# Patient Record
Sex: Female | Born: 1965 | Race: White | Hispanic: No | Marital: Married | Smoking: Never smoker
Health system: Southern US, Community
[De-identification: ages and names within clinical notes are randomized; demographics above are authoritative.]

## PROBLEM LIST (undated history)

## (undated) DIAGNOSIS — M1990 Unspecified osteoarthritis, unspecified site: Secondary | ICD-10-CM

## (undated) HISTORY — PX: BACK SURGERY: SHX140

---

## 1998-01-06 ENCOUNTER — Inpatient Hospital Stay (HOSPITAL_COMMUNITY): Admission: AD | Admit: 1998-01-06 | Discharge: 1998-01-06 | Payer: Self-pay | Admitting: Obstetrics and Gynecology

## 1998-01-15 ENCOUNTER — Inpatient Hospital Stay (HOSPITAL_COMMUNITY): Admission: AD | Admit: 1998-01-15 | Discharge: 1998-01-15 | Payer: Self-pay | Admitting: Obstetrics and Gynecology

## 1998-01-17 ENCOUNTER — Inpatient Hospital Stay (HOSPITAL_COMMUNITY): Admission: AD | Admit: 1998-01-17 | Discharge: 1998-01-20 | Payer: Self-pay | Admitting: Obstetrics & Gynecology

## 1998-01-21 ENCOUNTER — Encounter (HOSPITAL_COMMUNITY): Admission: RE | Admit: 1998-01-21 | Discharge: 1998-03-04 | Payer: Self-pay | Admitting: Obstetrics & Gynecology

## 1999-02-23 ENCOUNTER — Other Ambulatory Visit: Admission: RE | Admit: 1999-02-23 | Discharge: 1999-02-23 | Payer: Self-pay | Admitting: Obstetrics and Gynecology

## 2002-10-26 ENCOUNTER — Other Ambulatory Visit: Admission: RE | Admit: 2002-10-26 | Discharge: 2002-10-26 | Payer: Self-pay | Admitting: Obstetrics and Gynecology

## 2009-01-21 ENCOUNTER — Ambulatory Visit: Payer: Self-pay | Admitting: Licensed Clinical Social Worker

## 2009-01-26 ENCOUNTER — Ambulatory Visit: Payer: Self-pay | Admitting: Licensed Clinical Social Worker

## 2015-02-24 ENCOUNTER — Ambulatory Visit: Payer: Self-pay | Admitting: Physical Therapy

## 2015-05-31 ENCOUNTER — Other Ambulatory Visit: Payer: Self-pay | Admitting: Physician Assistant

## 2015-05-31 NOTE — H&P (Addendum)
Melissa [name] comes in today for a follow-up. I went over her skin with her. Given her symptoms and what she does, at the end of the day I do not think this will ever improve until she has an arthroscopy and decompression.  Her scan shows a picture of impingement with degenerative signal within the distal supraspinatus tendon and tuberosity. There are some findings of adhesive capsulitis but on exam that is really not the case.    PHYSICAL EXAMINATION:  Lungs clear to auscultation bilaterally.  Heart sounds normal. She has markedly positive impingement. She lacks a little internal rotation but there is not a lot of adhesive capsulitis.   PLAN:  I reviewed the report and the scan; I spent more than 25 minutes with Melissa [name] and her husband going over things.  We will try one more shot. Continue with her exercise program.  In the interim I will go ahead and proceed with scheduling an arthroscopy and decompression.  The procedure, risks and benefits as complications were reviewed. How long she will be out of work is reviewed.   I will wait to hear from her for a definitive decision after we see how she does with the most recent shot.   PROCEDURE NOTE: The patient's clinical condition is marked by substantial pain and/or significant functional disability. Other conservative therapy has not provided relief, is contraindicated, or not appropriate. There is a reasonable likelihood that injection will significantly improve the patient's pain and/or functional disability.   After appropriate consent and under sterile technique the posterior shoulder is prepped with Betadine and alcohol and injected into the subacromial interval with 40 mg cc of Depo-Medrol and 1 cc of Marcaine.  Patient tolerates the procedure without difficulty.

## 2015-06-02 ENCOUNTER — Encounter (HOSPITAL_BASED_OUTPATIENT_CLINIC_OR_DEPARTMENT_OTHER): Payer: Self-pay | Admitting: *Deleted

## 2015-06-09 ENCOUNTER — Ambulatory Visit (HOSPITAL_BASED_OUTPATIENT_CLINIC_OR_DEPARTMENT_OTHER): Payer: 59 | Admitting: Anesthesiology

## 2015-06-09 ENCOUNTER — Encounter (HOSPITAL_BASED_OUTPATIENT_CLINIC_OR_DEPARTMENT_OTHER): Payer: Self-pay | Admitting: *Deleted

## 2015-06-09 ENCOUNTER — Ambulatory Visit (HOSPITAL_BASED_OUTPATIENT_CLINIC_OR_DEPARTMENT_OTHER)
Admission: RE | Admit: 2015-06-09 | Discharge: 2015-06-09 | Disposition: A | Discharge status: Home / Self Care | Payer: 59 | Source: Ambulatory Visit | Attending: Orthopedic Surgery | Admitting: Orthopedic Surgery

## 2015-06-09 ENCOUNTER — Encounter (HOSPITAL_BASED_OUTPATIENT_CLINIC_OR_DEPARTMENT_OTHER)
Admission: RE | Disposition: A | Discharge status: Home / Self Care | Payer: Self-pay | Source: Ambulatory Visit | Attending: Orthopedic Surgery

## 2015-06-09 DIAGNOSIS — M7541 Impingement syndrome of right shoulder: Secondary | ICD-10-CM | POA: Insufficient documentation

## 2015-06-09 DIAGNOSIS — M19011 Primary osteoarthritis, right shoulder: Secondary | ICD-10-CM | POA: Insufficient documentation

## 2015-06-09 HISTORY — PX: RESECTION DISTAL CLAVICAL: SHX5053

## 2015-06-09 HISTORY — PX: SHOULDER ARTHROSCOPY WITH ROTATOR CUFF REPAIR AND SUBACROMIAL DECOMPRESSION: SHX5686

## 2015-06-09 HISTORY — DX: Unspecified osteoarthritis, unspecified site: M19.90

## 2015-06-09 SURGERY — SHOULDER ARTHROSCOPY WITH ROTATOR CUFF REPAIR AND SUBACROMIAL DECOMPRESSION
Anesthesia: Regional | Site: Shoulder | Laterality: Right

## 2015-06-09 MED ORDER — OXYCODONE HCL 5 MG PO TABS: 5.0000 mg | ORAL_TABLET | Freq: Once | ORAL | Status: DC | PRN

## 2015-06-09 MED ORDER — ONDANSETRON HCL 4 MG/2ML IJ SOLN
INTRAMUSCULAR | Status: DC | PRN
  Administered 2015-06-09: 4 mg via INTRAVENOUS

## 2015-06-09 MED ORDER — ONDANSETRON HCL 4 MG PO TABS: 4.0000 mg | ORAL_TABLET | Freq: Three times a day (TID) | ORAL | Status: AC | PRN

## 2015-06-09 MED ORDER — CEFAZOLIN SODIUM-DEXTROSE 2-3 GM-% IV SOLR
INTRAVENOUS | Status: AC
  Filled 2015-06-09: qty 50

## 2015-06-09 MED ORDER — LABETALOL HCL 5 MG/ML IV SOLN
INTRAVENOUS | Status: DC | PRN
  Administered 2015-06-09: 2.5 mg via INTRAVENOUS

## 2015-06-09 MED ORDER — SODIUM CHLORIDE 0.9 % IR SOLN
Status: DC | PRN
  Administered 2015-06-09: 11000 mL

## 2015-06-09 MED ORDER — MIDAZOLAM HCL 2 MG/2ML IJ SOLN
1.0000 mg | INTRAMUSCULAR | Status: DC | PRN
  Administered 2015-06-09: 2 mg via INTRAVENOUS

## 2015-06-09 MED ORDER — MEPERIDINE HCL 25 MG/ML IJ SOLN: 6.2500 mg | INTRAMUSCULAR | Status: DC | PRN

## 2015-06-09 MED ORDER — OXYCODONE HCL 5 MG/5ML PO SOLN: 5.0000 mg | Freq: Once | ORAL | Status: DC | PRN

## 2015-06-09 MED ORDER — LACTATED RINGERS IV SOLN
INTRAVENOUS | Status: DC
  Administered 2015-06-09: 08:00:00 via INTRAVENOUS

## 2015-06-09 MED ORDER — MIDAZOLAM HCL 2 MG/2ML IJ SOLN
INTRAMUSCULAR | Status: AC
  Filled 2015-06-09: qty 2

## 2015-06-09 MED ORDER — CEFAZOLIN SODIUM-DEXTROSE 2-3 GM-% IV SOLR
2.0000 g | INTRAVENOUS | Status: AC
  Administered 2015-06-09: 2 g via INTRAVENOUS

## 2015-06-09 MED ORDER — PROPOFOL 10 MG/ML IV BOLUS
INTRAVENOUS | Status: DC | PRN
  Administered 2015-06-09: 200 mg via INTRAVENOUS

## 2015-06-09 MED ORDER — OXYCODONE-ACETAMINOPHEN 5-325 MG PO TABS: 1.0000 | ORAL_TABLET | ORAL | Status: DC | PRN

## 2015-06-09 MED ORDER — DEXAMETHASONE SODIUM PHOSPHATE 10 MG/ML IJ SOLN
INTRAMUSCULAR | Status: AC
  Filled 2015-06-09: qty 1

## 2015-06-09 MED ORDER — SCOPOLAMINE 1 MG/3DAYS TD PT72: 1.0000 | MEDICATED_PATCH | Freq: Once | TRANSDERMAL | Status: DC | PRN

## 2015-06-09 MED ORDER — LABETALOL HCL 5 MG/ML IV SOLN
INTRAVENOUS | Status: AC
  Filled 2015-06-09: qty 4

## 2015-06-09 MED ORDER — FENTANYL CITRATE (PF) 100 MCG/2ML IJ SOLN
50.0000 ug | INTRAMUSCULAR | Status: DC | PRN
  Administered 2015-06-09: 50 ug via INTRAVENOUS
  Administered 2015-06-09: 100 ug via INTRAVENOUS

## 2015-06-09 MED ORDER — LIDOCAINE HCL (CARDIAC) 20 MG/ML IV SOLN
INTRAVENOUS | Status: AC
  Filled 2015-06-09: qty 5

## 2015-06-09 MED ORDER — GLYCOPYRROLATE 0.2 MG/ML IJ SOLN: 0.2000 mg | Freq: Once | INTRAMUSCULAR | Status: DC | PRN

## 2015-06-09 MED ORDER — ONDANSETRON HCL 4 MG/2ML IJ SOLN
INTRAMUSCULAR | Status: AC
  Filled 2015-06-09: qty 2

## 2015-06-09 MED ORDER — DEXAMETHASONE SODIUM PHOSPHATE 4 MG/ML IJ SOLN
INTRAMUSCULAR | Status: DC | PRN
  Administered 2015-06-09: 10 mg via INTRAVENOUS

## 2015-06-09 MED ORDER — PROMETHAZINE HCL 25 MG/ML IJ SOLN: 6.2500 mg | INTRAMUSCULAR | Status: DC | PRN

## 2015-06-09 MED ORDER — LACTATED RINGERS IV SOLN: INTRAVENOUS | Status: DC

## 2015-06-09 MED ORDER — SUCCINYLCHOLINE CHLORIDE 20 MG/ML IJ SOLN
INTRAMUSCULAR | Status: DC | PRN
  Administered 2015-06-09: 100 mg via INTRAVENOUS

## 2015-06-09 MED ORDER — CHLORHEXIDINE GLUCONATE 4 % EX LIQD: 60.0000 mL | Freq: Once | CUTANEOUS | Status: DC

## 2015-06-09 MED ORDER — FENTANYL CITRATE (PF) 100 MCG/2ML IJ SOLN
INTRAMUSCULAR | Status: AC
  Filled 2015-06-09: qty 2

## 2015-06-09 MED ORDER — BUPIVACAINE-EPINEPHRINE (PF) 0.5% -1:200000 IJ SOLN
INTRAMUSCULAR | Status: DC | PRN
  Administered 2015-06-09: 30 mL via PERINEURAL

## 2015-06-09 MED ORDER — FENTANYL CITRATE (PF) 100 MCG/2ML IJ SOLN
INTRAMUSCULAR | Status: AC
Start: 2015-06-09 — End: 2015-06-09
  Filled 2015-06-09: qty 2

## 2015-06-09 MED ORDER — HYDROMORPHONE HCL 1 MG/ML IJ SOLN
0.2500 mg | INTRAMUSCULAR | Status: DC | PRN
Start: 2015-06-09 — End: 2015-06-09

## 2015-06-09 MED ORDER — LIDOCAINE HCL (CARDIAC) 20 MG/ML IV SOLN
INTRAVENOUS | Status: DC | PRN
  Administered 2015-06-09: 50 mg via INTRAVENOUS

## 2015-06-09 SURGICAL SUPPLY — 70 items
ANCHOR SUT BIO SW 4.75X19.1 (Anchor) ×6 IMPLANT
BENZOIN TINCTURE PRP APPL 2/3 (GAUZE/BANDAGES/DRESSINGS) IMPLANT
BLADE CUTTER GATOR 3.5 (BLADE) ×3 IMPLANT
BLADE CUTTER MENIS 5.5 (BLADE) IMPLANT
BLADE GREAT WHITE 4.2 (BLADE) ×2 IMPLANT
BLADE GREAT WHITE 4.2MM (BLADE) ×1
BLADE SURG 15 STRL LF DISP TIS (BLADE) IMPLANT
BLADE SURG 15 STRL SS (BLADE)
BUR OVAL 6.0 (BURR) ×3 IMPLANT
CANNULA DRY DOC 8X75 (CANNULA) ×3 IMPLANT
CANNULA TWIST IN 8.25X7CM (CANNULA) IMPLANT
CLOSURE WOUND 1/2 X4 (GAUZE/BANDAGES/DRESSINGS)
DECANTER SPIKE VIAL GLASS SM (MISCELLANEOUS) IMPLANT
DRAPE STERI 35X30 U-POUCH (DRAPES) ×3 IMPLANT
DRAPE U-SHAPE 47X51 STRL (DRAPES) ×3 IMPLANT
DRAPE U-SHAPE 76X120 STRL (DRAPES) ×6 IMPLANT
DRSG PAD ABDOMINAL 8X10 ST (GAUZE/BANDAGES/DRESSINGS) ×3 IMPLANT
DURAPREP 26ML APPLICATOR (WOUND CARE) ×3 IMPLANT
ELECT MENISCUS 165MM 90D (ELECTRODE) ×3 IMPLANT
ELECT REM PT RETURN 9FT ADLT (ELECTROSURGICAL) ×3
ELECTRODE REM PT RTRN 9FT ADLT (ELECTROSURGICAL) ×1 IMPLANT
GAUZE SPONGE 4X4 12PLY STRL (GAUZE/BANDAGES/DRESSINGS) ×6 IMPLANT
GAUZE XEROFORM 1X8 LF (GAUZE/BANDAGES/DRESSINGS) ×3 IMPLANT
GLOVE BIOGEL PI IND STRL 7.0 (GLOVE) ×3 IMPLANT
GLOVE BIOGEL PI INDICATOR 7.0 (GLOVE) ×6
GLOVE ECLIPSE 6.5 STRL STRAW (GLOVE) ×3 IMPLANT
GLOVE ECLIPSE 7.0 STRL STRAW (GLOVE) ×3 IMPLANT
GLOVE SURG ORTHO 8.0 STRL STRW (GLOVE) ×3 IMPLANT
GOWN STRL REUS W/ TWL LRG LVL3 (GOWN DISPOSABLE) ×2 IMPLANT
GOWN STRL REUS W/ TWL XL LVL3 (GOWN DISPOSABLE) ×1 IMPLANT
GOWN STRL REUS W/TWL LRG LVL3 (GOWN DISPOSABLE) ×4
GOWN STRL REUS W/TWL XL LVL3 (GOWN DISPOSABLE) ×2
IV NS IRRIG 3000ML ARTHROMATIC (IV SOLUTION) ×12 IMPLANT
MANIFOLD NEPTUNE II (INSTRUMENTS) ×3 IMPLANT
NDL SUT 6 .5 CRC .975X.05 MAYO (NEEDLE) IMPLANT
NEEDLE MAYO TAPER (NEEDLE)
NEEDLE SCORPION MULTI FIRE (NEEDLE) ×3 IMPLANT
NS IRRIG 1000ML POUR BTL (IV SOLUTION) IMPLANT
PACK ARTHROSCOPY DSU (CUSTOM PROCEDURE TRAY) ×3 IMPLANT
PACK BASIN DAY SURGERY FS (CUSTOM PROCEDURE TRAY) ×3 IMPLANT
PASSER SUT SWANSON 36MM LOOP (INSTRUMENTS) IMPLANT
PENCIL BUTTON HOLSTER BLD 10FT (ELECTRODE) ×3 IMPLANT
SET ARTHROSCOPY TUBING (MISCELLANEOUS) ×2
SET ARTHROSCOPY TUBING LN (MISCELLANEOUS) ×1 IMPLANT
SLEEVE SCD COMPRESS KNEE MED (MISCELLANEOUS) IMPLANT
SLING ARM FOAM STRAP LRG (SOFTGOODS) IMPLANT
SLING ARM IMMOBILIZER LRG (SOFTGOODS) ×3 IMPLANT
SLING ARM IMMOBILIZER MED (SOFTGOODS) IMPLANT
SLING ARM MED ADULT FOAM STRAP (SOFTGOODS) IMPLANT
SLING ARM XL FOAM STRAP (SOFTGOODS) IMPLANT
SPONGE LAP 4X18 X RAY DECT (DISPOSABLE) IMPLANT
STRIP CLOSURE SKIN 1/2X4 (GAUZE/BANDAGES/DRESSINGS) IMPLANT
SUCTION FRAZIER TIP 10 FR DISP (SUCTIONS) IMPLANT
SUT ETHIBOND 2 OS 4 DA (SUTURE) IMPLANT
SUT ETHILON 2 0 FS 18 (SUTURE) IMPLANT
SUT ETHILON 3 0 PS 1 (SUTURE) IMPLANT
SUT FIBERWIRE #2 38 T-5 BLUE (SUTURE)
SUT RETRIEVER MED (INSTRUMENTS) IMPLANT
SUT TIGER TAPE 7 IN WHITE (SUTURE) ×6 IMPLANT
SUT VIC AB 0 CT1 27 (SUTURE)
SUT VIC AB 0 CT1 27XBRD ANBCTR (SUTURE) IMPLANT
SUT VIC AB 2-0 SH 27 (SUTURE)
SUT VIC AB 2-0 SH 27XBRD (SUTURE) IMPLANT
SUT VIC AB 3-0 FS2 27 (SUTURE) IMPLANT
SUTURE FIBERWR #2 38 T-5 BLUE (SUTURE) IMPLANT
TAPE FIBER 2MM 7IN #2 BLUE (SUTURE) ×3 IMPLANT
TOWEL OR 17X24 6PK STRL BLUE (TOWEL DISPOSABLE) ×3 IMPLANT
TOWEL OR NON WOVEN STRL DISP B (DISPOSABLE) ×3 IMPLANT
WATER STERILE IRR 1000ML POUR (IV SOLUTION) ×3 IMPLANT
YANKAUER SUCT BULB TIP NO VENT (SUCTIONS) IMPLANT

## 2015-06-09 NOTE — Discharge Instructions (Signed)
Shouder arthroscopy, rotator cuff repair, subacromial decompression °Care After Instructions °Refer to this sheet in the next few weeks. These discharge instructions provide you with general information on caring for yourself after you leave the hospital. Your caregiver may also give you specific instructions. Your treatment has been planned according to the most current medical practices available, but unavoidable complications sometimes occur. If you have any problems or questions after discharge, please call your caregiver. °HOME INSTRUCTIONS °You may resume a normal diet and activities as directed.  °Take showers instead of baths until informed otherwise.  °Change bandages (dressings) in 3 days.  Swab wounds daily with betadine.  Wash shoulder with soap and water.  Pat dry.  Cover wounds with bandaids. °Only take over-the-counter or prescription medicines for pain, discomfort, or fever as directed by your caregiver.  °Wear your sling for the next 6 weeks unless otherwise instructed. °Eat a well-balanced diet.  °Avoid lifting or driving until you are instructed otherwise.  °Make an appointment to see your caregiver for stitches (suture) or staple removal one week after surgery.  ° °SEEK MEDICAL CARE IF: °You have swelling of your calf or leg.  °You develop shortness of breath or chest pain.  °You have redness, swelling, or increasing pain in the wound.  °There is pus or any unusual drainage coming from the surgical site.  °You notice a bad smell coming from the surgical site or dressing.  °The surgical site breaks open after sutures or staples have been removed.  °There is persistent bleeding from the suture or staple line.  °You are getting worse or are not improving.  °You have any other questions or concerns.  °SEEK IMMEDIATE MEDICAL CARE IF:  °You have a fever greater than 101 °You develop a rash.  °You have difficulty breathing.  °You develop any reaction or side effects to medicines given.  °Your knee  motion is decreasing rather than improving.  °MAKE SURE YOU:  °Understand these instructions.  °Will watch your condition.  °Will get help right away if you are not doing well or get worse.  ° ° ° °Regional Anesthesia Blocks ° °1. Numbness or the inability to move the "blocked" extremity may last from 3-48 hours after placement. The length of time depends on the medication injected and your individual response to the medication. If the numbness is not going away after 48 hours, call your surgeon. ° °2. The extremity that is blocked will need to be protected until the numbness is gone and the  Strength has returned. Because you cannot feel it, you will need to take extra care to avoid injury. Because it may be weak, you may have difficulty moving it or using it. You may not know what position it is in without looking at it while the block is in effect. ° °3. For blocks in the legs and feet, returning to weight bearing and walking needs to be done carefully. You will need to wait until the numbness is entirely gone and the strength has returned. You should be able to move your leg and foot normally before you try and bear weight or walk. You will need someone to be with you when you first try to ensure you do not fall and possibly risk injury. ° °4. Bruising and tenderness at the needle site are common side effects and will resolve in a few days. ° °5. Persistent numbness or new problems with movement should be communicated to the surgeon or the [facility] (  [phone])/ [facility] ([phone]). ° ° ° ° ° ° °Post Anesthesia Home Care Instructions ° °Activity: °Get plenty of rest for the remainder of the day. A responsible adult should stay with you for 24 hours following the procedure.  °For the next 24 hours, DO NOT: °-Drive a car °-Operate machinery °-Drink alcoholic beverages °-Take any medication unless instructed by your physician °-Make any legal decisions or sign important  papers. ° °Meals: °Start with liquid foods such as gelatin or soup. Progress to regular foods as tolerated. Avoid greasy, spicy, heavy foods. If nausea and/or vomiting occur, drink only clear liquids until the nausea and/or vomiting subsides. Call your physician if vomiting continues. ° °Special Instructions/Symptoms: °Your throat may feel dry or sore from the anesthesia or the breathing tube placed in your throat during surgery. If this causes discomfort, gargle with warm salt water. The discomfort should disappear within 24 hours. ° °If you had a scopolamine patch placed behind your ear for the management of post- operative nausea and/or vomiting: ° °1. The medication in the patch is effective for 72 hours, after which it should be removed.  Wrap patch in a tissue and discard in the trash. Wash hands thoroughly with soap and water. °2. You may remove the patch earlier than 72 hours if you experience unpleasant side effects which may include dry mouth, dizziness or visual disturbances. °3. Avoid touching the patch. Wash your hands with soap and water after contact with the patch. °  ° °

## 2015-06-09 NOTE — Transfer of Care (Signed)
Immediate Anesthesia Transfer of Care Note  Patient: Melissa [name]  Procedure(s) Performed: Procedure(s) with comments: RIGHT SHOULDER SCOPE DEBRIDEMENT, ACROMIOPLASTY,  DISTAL CLAVICAL EXCISION, ARTHROSCOPIC ROTATOR CUFF REPAIR (Right) - ANESTHESIA:  GENERAL, PRE/POST OP SCALENE RESECTION DISTAL CLAVICAL (Right)  Patient Location: PACU  Anesthesia Type:General  Level of Consciousness: awake and sedated  Airway & Oxygen Therapy: Patient Spontanous Breathing and Patient connected to face mask oxygen  Post-op Assessment: Report given to RN and Post -op Vital signs reviewed and stable  Post vital signs: Reviewed and stable  Last Vitals:  Filed Vitals:   06/09/15 0803 06/09/15 0805  BP:  123/72  Pulse: 80 75  Temp:    Resp: 17 16    Complications: No apparent anesthesia complications

## 2015-06-09 NOTE — Progress Notes (Signed)
Assisted Dr. Germeroth with right, ultrasound guided, interscalene  block. Side rails up, monitors on throughout procedure. See vital signs in flow sheet. Tolerated Procedure well.  

## 2015-06-09 NOTE — Anesthesia Postprocedure Evaluation (Signed)
Anesthesia Post Note  Patient: Melissa [name]  Procedure(s) Performed: Procedure(s) (LRB): RIGHT SHOULDER SCOPE DEBRIDEMENT, ACROMIOPLASTY,  DISTAL CLAVICAL EXCISION, ARTHROSCOPIC ROTATOR CUFF REPAIR (Right) RESECTION DISTAL CLAVICAL (Right)  Patient location during evaluation: PACU Anesthesia Type: General and Regional Level of consciousness: sedated and patient cooperative Pain management: pain level controlled Vital Signs Assessment: post-procedure vital signs reviewed and stable Respiratory status: spontaneous breathing Cardiovascular status: stable Anesthetic complications: no    Last Vitals:  Filed Vitals:   06/09/15 1030 06/09/15 1136  BP: 105/65 112/75  Pulse: 74 84  Temp:  36.6 C  Resp: 13 18    Last Pain:  Filed Vitals:   06/09/15 1148  PainSc: 0-No pain                 Lewie LoronJohn Roarke Marciano

## 2015-06-09 NOTE — Interval H&P Note (Signed)
History and Physical Interval Note:  06/09/2015 7:33 AM  Melissa [name]  has presented today for surgery, with the diagnosis of OTHER ARTICULAR CARTILAGE DISORDERS RIGHT SHOULDER, PRIMARY OSTEOARTHRITIS RIGHT SHOULDER, IMPINGEMENT SYNDROME OF RIGHT SHOULDER   The various methods of treatment have been discussed with the patient and family. After consideration of risks, benefits and other options for treatment, the patient has consented to  Procedure(s) with comments: RIGHT SHOULDER SCOPE DEBRIDEMENT, ACROMIOPLASTY,  DISTAL CLAVICAL EXCISION (Right) - ANESTHESIA:  GENERAL, PRE/POST OP SCALENE as a surgical intervention .  The patient's history has been reviewed, patient examined, no change in status, stable for surgery.  I have reviewed the patient's chart and labs.  Questions were answered to the patient's satisfaction.     Loreta Aveaniel F Nicollette Wilhelmi

## 2015-06-09 NOTE — Anesthesia Preprocedure Evaluation (Signed)
Anesthesia Evaluation  Patient identified by MRN, date of birth, ID band Patient awake    Reviewed: Allergy & Precautions, NPO status , Patient's Chart, lab work & pertinent test results  Airway Mallampati: II  TM Distance: >3 FB Neck ROM: Full    Dental no notable dental hx.    Pulmonary neg pulmonary ROS,    Pulmonary exam normal breath sounds clear to auscultation       Cardiovascular negative cardio ROS Normal cardiovascular exam Rhythm:Regular Rate:Normal     Neuro/Psych negative neurological ROS  negative psych ROS   GI/Hepatic negative GI ROS, Neg liver ROS,   Endo/Other  negative endocrine ROS  Renal/GU negative Renal ROS     Musculoskeletal  (+) Arthritis ,   Abdominal   Peds  Hematology negative hematology ROS (+)   Anesthesia Other Findings   Reproductive/Obstetrics negative OB ROS                             Anesthesia Physical Anesthesia Plan  ASA: II  Anesthesia Plan: General and Regional   Post-op Pain Management: MAC Combined w/ Regional for Post-op pain   Induction: Intravenous  Airway Management Planned: Oral ETT  Additional Equipment:   Intra-op Plan:   Post-operative Plan: Extubation in OR  Informed Consent: I have reviewed the patients History and Physical, chart, labs and discussed the procedure including the risks, benefits and alternatives for the proposed anesthesia with the patient or authorized representative who has indicated his/her understanding and acceptance.   Dental advisory given  Plan Discussed with: CRNA  Anesthesia Plan Comments:         Anesthesia Quick Evaluation

## 2015-06-09 NOTE — Anesthesia Procedure Notes (Addendum)
Anesthesia Regional Block:  Interscalene brachial plexus block  Pre-Anesthetic Checklist: ,, timeout performed, Correct Patient, Correct Site, Correct Laterality, Correct Procedure, Correct Position, site marked, Risks and benefits discussed, Surgical consent,  Pre-op evaluation,  Post-op pain management  Laterality: Right  Prep: chloraprep       Needles:  Injection technique: Single-shot  Needle Type: Stimiplex          Additional Needles:  Procedures: ultrasound guided (picture in chart) Interscalene brachial plexus block Narrative:   Performed by: Personally  Anesthesiologist: Lewie LoronGERMEROTH, JOHN  Additional Notes: Patient tolerated the procedure well without complications   Procedure Name: Intubation Performed by: York GricePEARSON, Chaley Castellanos W Pre-anesthesia Checklist: Patient identified, Emergency Drugs available, Suction available and Patient being monitored Patient Re-evaluated:Patient Re-evaluated prior to inductionOxygen Delivery Method: Circle System Utilized Preoxygenation: Pre-oxygenation with 100% oxygen Intubation Type: IV induction Ventilation: Mask ventilation without difficulty Laryngoscope Size: Miller and 2 Grade View: Grade I Tube type: Oral Number of attempts: 1 Airway Equipment and Method: Stylet and Oral airway Placement Confirmation: ETT inserted through vocal cords under direct vision,  positive ETCO2 and breath sounds checked- equal and bilateral Tube secured with: Tape Dental Injury: Teeth and Oropharynx as per pre-operative assessment

## 2015-06-10 ENCOUNTER — Encounter (HOSPITAL_BASED_OUTPATIENT_CLINIC_OR_DEPARTMENT_OTHER): Payer: Self-pay | Admitting: Orthopedic Surgery

## 2015-06-10 NOTE — Op Note (Addendum)
NAMQuincy [name]:  [name], Melissa              ACCOUNT NO.:  [id number]  MEDICAL RECORD NO.:  [id number]  LOCATION:                               FACILITY:  [facility]  PHYSICIAN:  Loreta Aveaniel F. Teosha Casso, M.D. DATE OF BIRTH:  [birth year]  DATE OF PROCEDURE:  06/09/2015 DATE OF DISCHARGE:  06/09/2015                              OPERATIVE REPORT   POSTOPERATIVE DIAGNOSES: 1. Right shoulder chronic impingement. 2. Abrasive changes of superior rotator cuff. 3. Degenerative arthritis, acromioclavicular (AC) joint.  POSTOPERATIVE DIAGNOSIS: 1. Right shoulder chronic impingement. 2. Abrasive changes of superior rotator cuff. 3. Degenerative arthritis, acromioclavicular (AC) joint, with a full-     thickness tear supraspinatus tendon throughout the crescent region.  PROCEDURE: 1. Right shoulder exam under anesthesia, arthroscopy. 2. Debridement mobilization rotator cuff. 3. Debridement of tuberosity. 4. Bursectomy acromioplasty CA ligament release. 5. Excision of distal clavicle. 6. Arthroscopic-assisted rotator cuff repair with FiberWire suture x2,     SwiveLock anchors x2.  SURGEON:  Loreta Aveaniel F. Yoandri Congrove, M.D.  ASSISTANT:  Mikey KirschnerLindsey Stanberry, PA., present throughout the entire case and necessary for timely completion of procedure.  ANESTHESIA:  General.  BLOOD LOSS:  Minimal.  SPECIMENS:  None.  CULTURES:  None.  COMPLICATIONS:  None.  DRESSINGS:  Soft compressive shoulder immobilizer.  PROCEDURE:  The patient was brought to the operating room, placed on the operating table in supine position.  After adequate anesthesia had been obtained, shoulder examined.  Full motion stable shoulder.  Placed in beach-chair position on the shoulder positioner, prepped and draped in usual sterile fashion.  Three portals anterior, posterior, and lateral. Arthroscope was introduced and inspected.  Articular cartilage, labrum, and biceps anchor intact.  An area of full-thickness tear in crescent region from  attrition from above to below.  This was debrided, mobilized above and below.  Cannula redirected subacromially.  Type 3 acromion. Reactive bursitis.  Bursa resected.  Acromioplasty to a type 1 acromion, releasing CA ligament.  Grade IV changes spurring AC joint. Periarticular spurs lateral centimeter of clavicle resected.  Lateral cannula.  Immobilized cuff was captured with 2 horizontal mattress sutures.  A scorpion device.  Firmly anchored down the tuberosity, which was roughened with 2 swivel lock anchors.  Nice firm watertight closure achieved.  Adequacy of decompression confirmed.  Instruments were fully removed.  Portals were closed with nylon.  Sterile compressive dressing applied.  Shoulder mobilizer applied.  Anesthesia reversed.  Brought to the recovery room.  Tolerated the surgery well.  No complications.     Loreta Aveaniel F. Dejae Bernet, M.D.     DFM/MEDQ  D:  06/09/2015  T:  06/09/2015  Job:  [id number]

## 2016-07-26 ENCOUNTER — Encounter: Payer: Self-pay | Admitting: Podiatry

## 2016-07-26 ENCOUNTER — Other Ambulatory Visit: Payer: Self-pay | Admitting: *Deleted

## 2016-07-26 ENCOUNTER — Ambulatory Visit (INDEPENDENT_AMBULATORY_CARE_PROVIDER_SITE_OTHER): Payer: 59 | Admitting: Podiatry

## 2016-07-26 ENCOUNTER — Ambulatory Visit (INDEPENDENT_AMBULATORY_CARE_PROVIDER_SITE_OTHER): Payer: 59

## 2016-07-26 VITALS — BP 128/73 | HR 63 | Resp 16 | Ht 67.0 in | Wt 180.0 lb

## 2016-07-26 DIAGNOSIS — M7752 Other enthesopathy of left foot: Secondary | ICD-10-CM | POA: Diagnosis not present

## 2016-07-26 DIAGNOSIS — M779 Enthesopathy, unspecified: Secondary | ICD-10-CM | POA: Diagnosis not present

## 2016-07-26 DIAGNOSIS — G5792 Unspecified mononeuropathy of left lower limb: Secondary | ICD-10-CM | POA: Diagnosis not present

## 2016-07-29 NOTE — Progress Notes (Signed)
She presents today with chief complaint of a painful left foot to the lateral side. Stasis been present like this for approximately one month. She denies any trauma.  Objective: Vital signs are stable she is alert and oriented 3. Pulses are palpable. Neurologic sensorium is intact. Deep tendon reflexes are intact. Muscle strength is 5 over 5 dorsiflexion plantar flexors and inverters everters on physical musculatures intact. She has pain overlying the dorsal aspect of the fifth metatarsal area of the left foot. Radiating pain is noted. This appears to be from the sural nerve.  Regressing today do not demonstrate any major osseous abnormalities.  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  Plan: I injected the area today with dexamethasone/triamcinolone and local anesthetic. Follow-up with her in 1 month. If she's not improved MRI will be necessary.

## 2016-08-30 ENCOUNTER — Ambulatory Visit: Payer: 59 | Admitting: Podiatry

## 2017-04-05 ENCOUNTER — Other Ambulatory Visit: Payer: Self-pay | Admitting: Obstetrics and Gynecology

## 2017-04-05 DIAGNOSIS — Z1239 Encounter for other screening for malignant neoplasm of breast: Secondary | ICD-10-CM

## 2017-04-19 ENCOUNTER — Other Ambulatory Visit: Payer: Self-pay | Admitting: Neurosurgery

## 2017-04-19 ENCOUNTER — Ambulatory Visit (INDEPENDENT_AMBULATORY_CARE_PROVIDER_SITE_OTHER): Payer: 59

## 2017-04-19 DIAGNOSIS — S129XXA Fracture of neck, unspecified, initial encounter: Secondary | ICD-10-CM

## 2017-04-19 DIAGNOSIS — M4322 Fusion of spine, cervical region: Secondary | ICD-10-CM | POA: Diagnosis not present

## 2017-04-19 DIAGNOSIS — Z1231 Encounter for screening mammogram for malignant neoplasm of breast: Secondary | ICD-10-CM

## 2017-04-19 DIAGNOSIS — Z1239 Encounter for other screening for malignant neoplasm of breast: Secondary | ICD-10-CM

## 2017-06-04 ENCOUNTER — Ambulatory Visit: Payer: 59 | Admitting: Podiatry

## 2017-06-04 DIAGNOSIS — G5792 Unspecified mononeuropathy of left lower limb: Secondary | ICD-10-CM

## 2017-06-04 NOTE — Progress Notes (Signed)
She presents today with a chief complaint of pain to the fifth metatarsal area of the left foot she states that it was doing fine for little while but came back with a vengeance.  Objective: Vital signs are stable she is alert and oriented x3.  He has pain on palpation of the fifth metatarsal base of the left foot and the peroneal tendon left foot.  At this point I feel that she either has a cyst in the bone based on radiographs or peroneal tendinitis or both.  Assessment: Painful fifth metatarsal base and peroneus brevis tendon.  Plan: Requesting MRI since conservative therapies have failed to render her asymptomatic.

## 2017-06-05 ENCOUNTER — Telehealth: Payer: Self-pay | Admitting: *Deleted

## 2017-06-05 DIAGNOSIS — M792 Neuralgia and neuritis, unspecified: Secondary | ICD-10-CM

## 2017-06-05 DIAGNOSIS — M779 Enthesopathy, unspecified: Secondary | ICD-10-CM

## 2017-06-05 NOTE — Telephone Encounter (Signed)
-----  Message from Rip Harbour, [facility] sent at 06/04/2017  2:34 PM EST ----- Regarding: MRI MRI left foot - evaluate 5th met base-bone cyst/peroneal tendonitis left - no improvement

## 2017-06-10 ENCOUNTER — Other Ambulatory Visit: Payer: Self-pay | Admitting: Neurosurgery

## 2017-06-10 DIAGNOSIS — M4722 Other spondylosis with radiculopathy, cervical region: Secondary | ICD-10-CM

## 2017-06-12 ENCOUNTER — Ambulatory Visit
Admission: RE | Admit: 2017-06-12 | Discharge: 2017-06-12 | Disposition: A | Discharge status: Home / Self Care | Payer: 59 | Source: Ambulatory Visit | Attending: Neurosurgery | Admitting: Neurosurgery

## 2017-06-12 DIAGNOSIS — M4722 Other spondylosis with radiculopathy, cervical region: Secondary | ICD-10-CM

## 2017-06-15 ENCOUNTER — Ambulatory Visit
Admission: RE | Admit: 2017-06-15 | Discharge: 2017-06-15 | Disposition: A | Discharge status: Home / Self Care | Payer: 59 | Source: Ambulatory Visit | Attending: Podiatry | Admitting: Podiatry

## 2017-06-19 ENCOUNTER — Telehealth: Payer: Self-pay | Admitting: *Deleted

## 2017-06-19 NOTE — Telephone Encounter (Signed)
Left message informing pt of Dr. Geryl RankinsHyatt's review of MRI results and orders to send to radiology specialist for more indepth evaluation for treatment planning. Mailed copy of MRI disc to [facility].

## 2017-06-19 NOTE — Telephone Encounter (Signed)
-----   Message from Elinor ParkinsonMax T Hyatt, [place] sent at 06/19/2017  7:22 AM EST ----- Send for an over read and inform patient of the delay.

## 2017-06-19 NOTE — Telephone Encounter (Signed)
Pt asked if Dr. Al CorpusHyatt often sent out the MRI to a specialist and I told her did on occasion for more information to plan treatment.

## 2017-06-19 NOTE — Telephone Encounter (Signed)
I'm returning a call to Melissa [name]. 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 She can call me at [phone] or my office line which is [phone] and I will be in the office from 7:00 - 3:00 tomorrow. Thank you.

## 2017-06-28 ENCOUNTER — Encounter: Payer: Self-pay | Admitting: Podiatry

## 2017-07-09 ENCOUNTER — Ambulatory Visit: Payer: 59 | Admitting: Podiatry

## 2017-07-09 ENCOUNTER — Encounter: Payer: Self-pay | Admitting: Podiatry

## 2017-07-09 DIAGNOSIS — M19072 Primary osteoarthritis, left ankle and foot: Secondary | ICD-10-CM | POA: Diagnosis not present

## 2017-07-09 MED ORDER — MELOXICAM 15 MG PO TABS: 15.0000 mg | ORAL_TABLET | Freq: Every day | ORAL | 3 refills | Status: AC

## 2017-07-10 NOTE — Progress Notes (Signed)
She presents today for follow-up of soreness to the dorsal lateral aspect of the left foot she states that it really has not bothered her that much and is not anything that she cannot live with.  States that is just a little bit tender and she is radiating pain occasionally.  Her graph objective: Vital signs are stable alert and oriented x3.  Pulses are palpable.  Objective: Vital signs are stable alert and oriented x3 still has a nonpalpable osseous mass to the dorsal aspect of the fourth and fifth metatarsal cuboid articulation mildly tender on palpation.  MRI results do demonstrate osteoarthritis of the fourth and fifth metatarsal articulation.  There is some bottom mild hypertrophy of the bone.  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  Plan: Discussed etiology pathology conservative versus surgical therapies.  She states that she is will continue to utilize her anti-inflammatories as needed and will follow up with her on an as-needed basis.  I did go ahead and write her prescription for meloxicam in case she needs it.

## 2017-07-25 ENCOUNTER — Encounter (INDEPENDENT_AMBULATORY_CARE_PROVIDER_SITE_OTHER): Payer: 59

## 2018-02-26 IMAGING — MG 2D DIGITAL SCREENING BILATERAL MAMMOGRAM WITH CAD AND ADJUNCT TO
8 series · 8 of 24 positions shown · non-contrast
Comparison: Previous exam(s).

CLINICAL DATA: Screening.

EXAM:
2D DIGITAL SCREENING BILATERAL MAMMOGRAM WITH CAD AND ADJUNCT TOMO

[L CC]
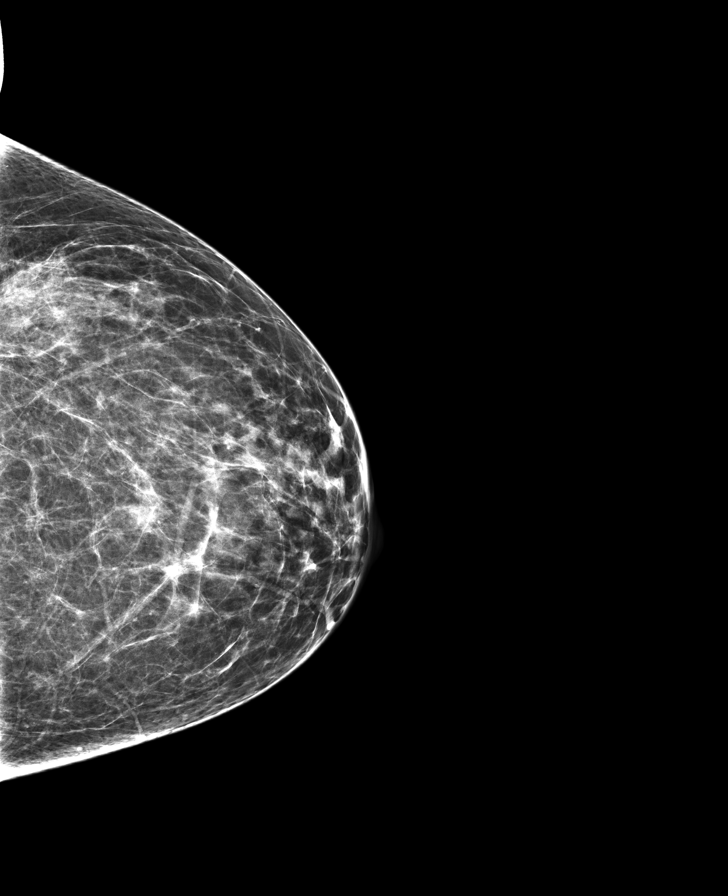

[L MLO]
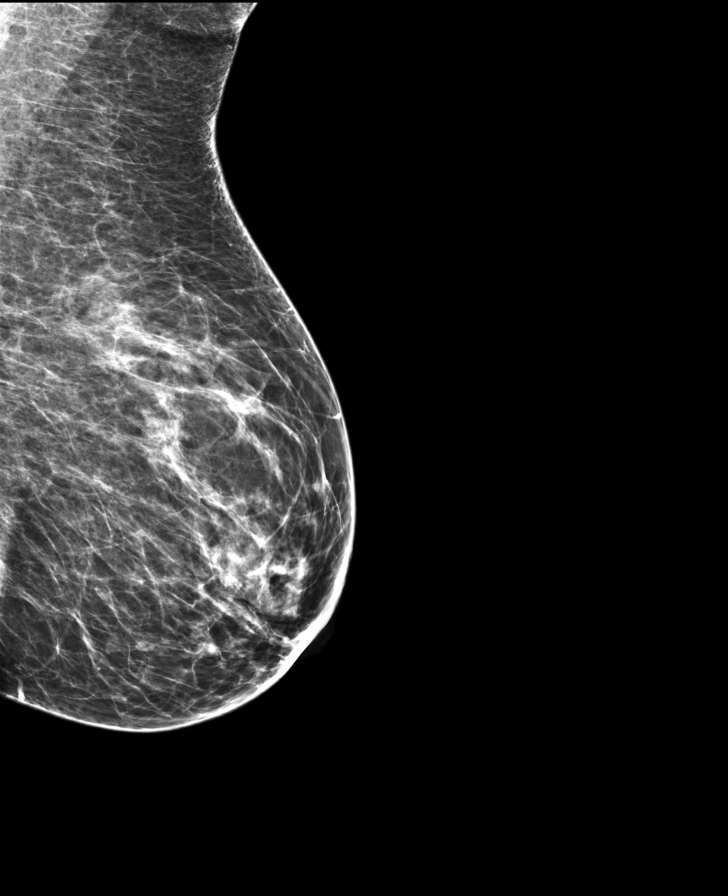

[R CC]
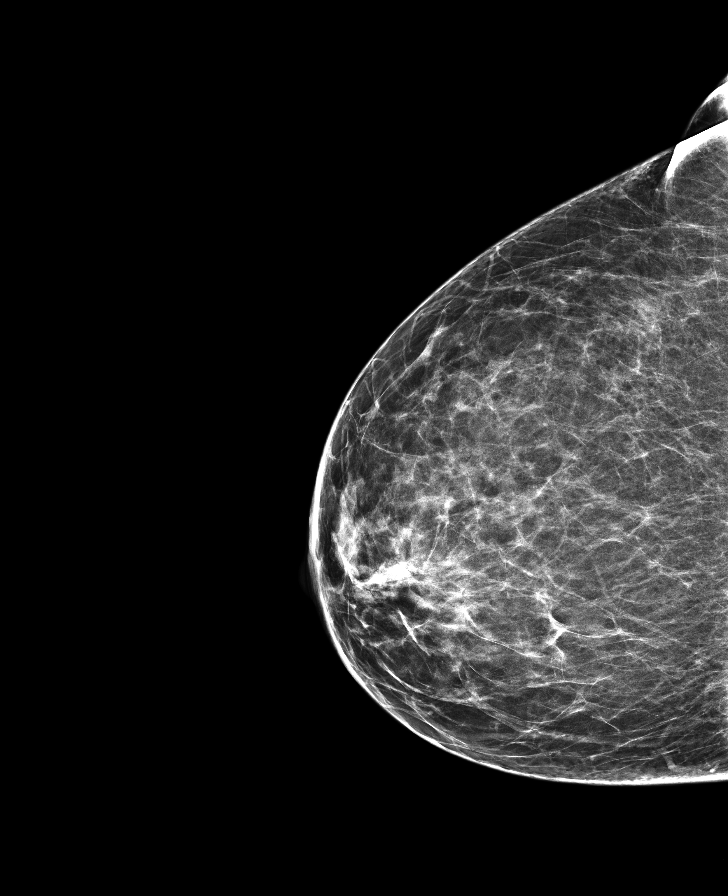

[R MLO]
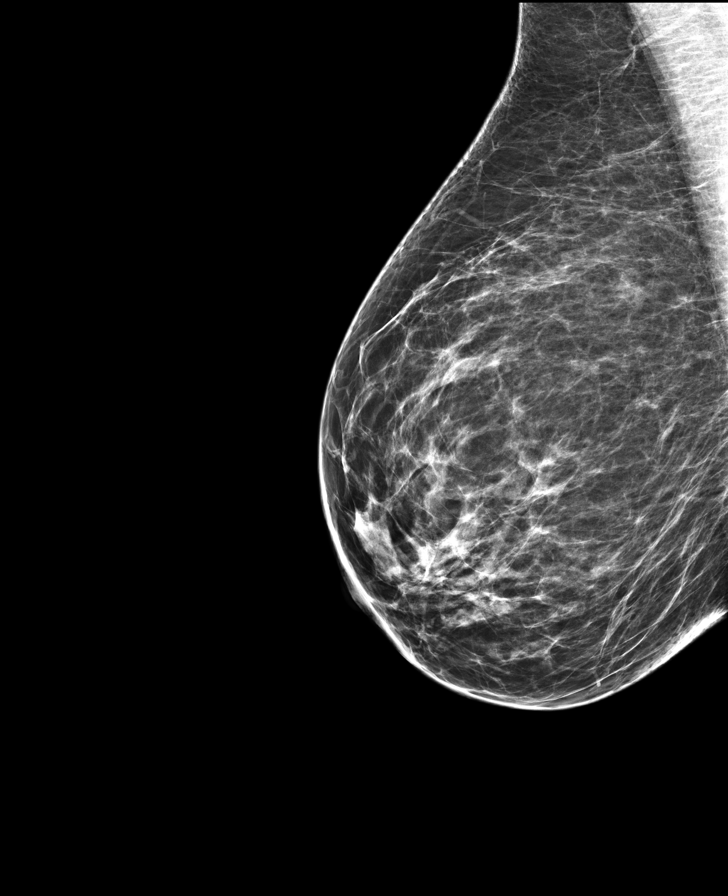

[L CC tomo · tomo slice 43/86.0]
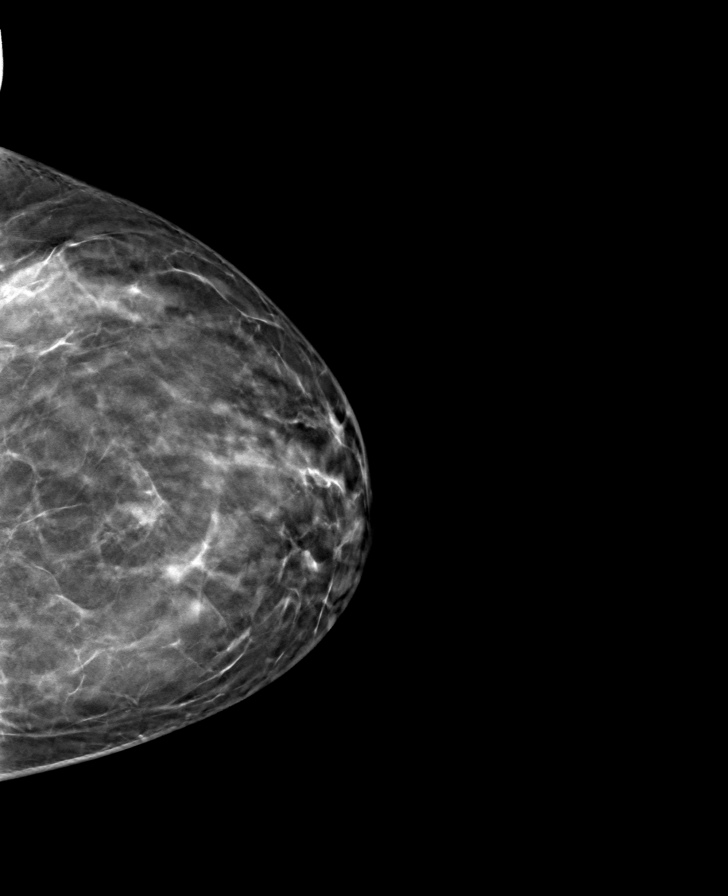

[L MLO tomo · tomo slice 43/86.0]
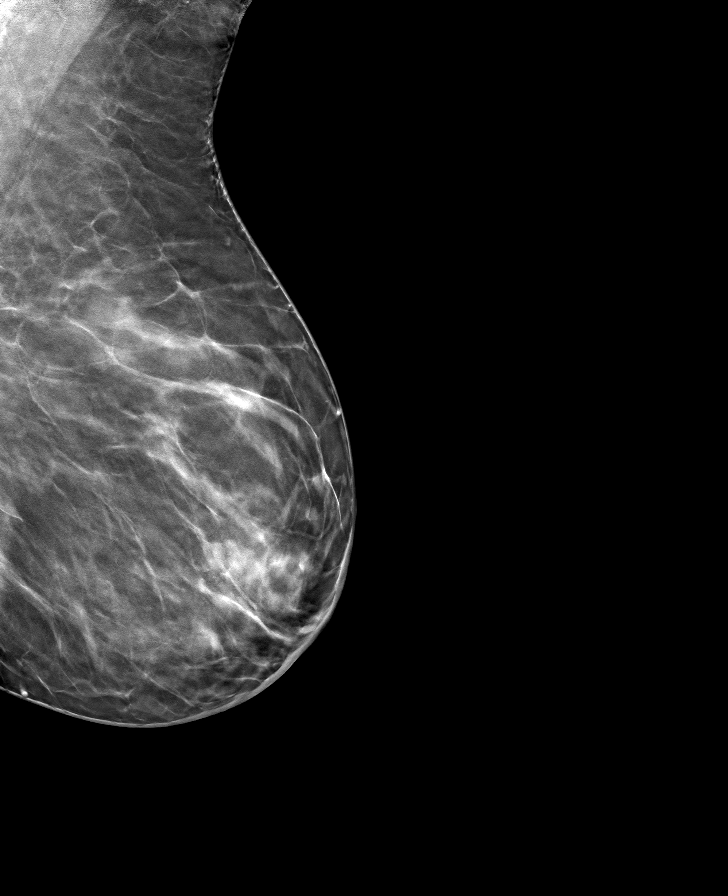

[R CC tomo · tomo slice 43/85.0]
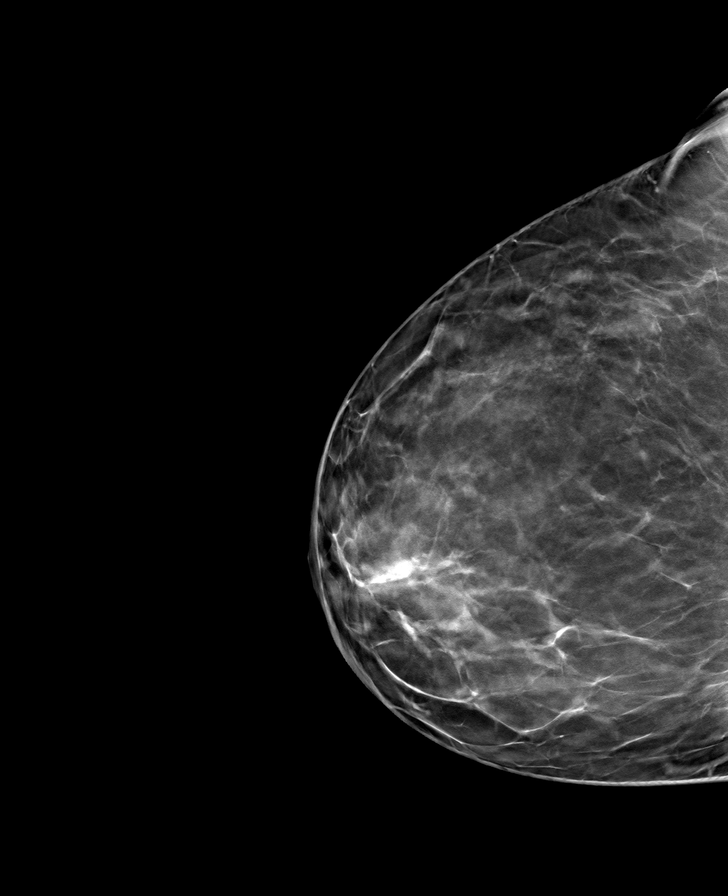

[R MLO tomo · tomo slice 45/88.0]
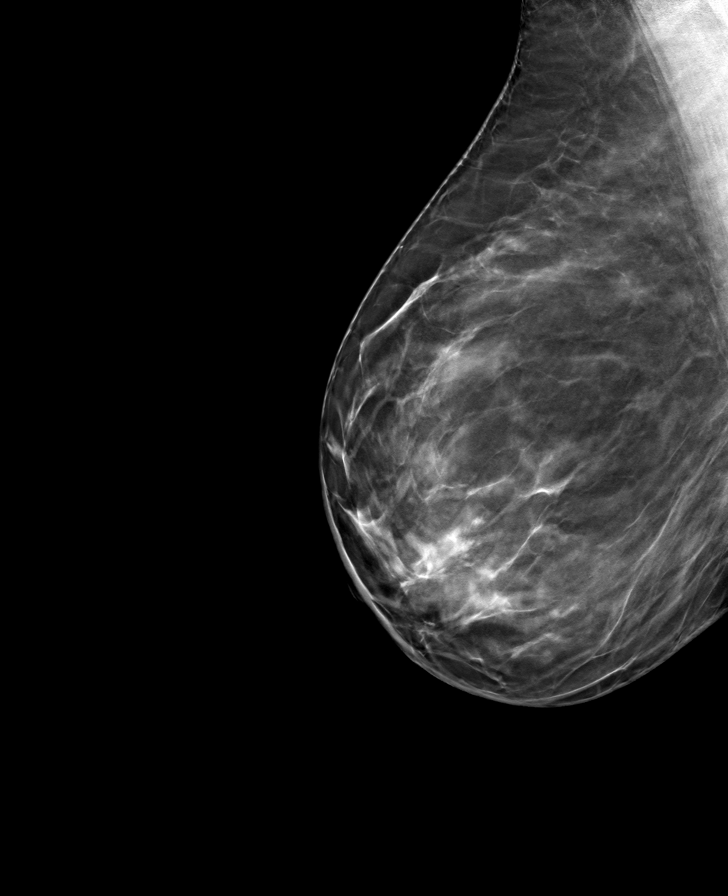

[8 of 24 positions shown; findings below may reference images not displayed]

ACR Breast Density Category c: The breast tissue is heterogeneously
dense, which may obscure small masses.
FINDINGS: There are no findings suspicious for malignancy. Images were
processed with CAD.
IMPRESSION: No mammographic evidence of malignancy. A result letter of this
screening mammogram will be mailed directly to the patient.

RECOMMENDATION:
Screening mammogram in one year. (Code:TN-[id number])

BI-RADS CATEGORY  1: Negative.
